# Patient Record
Sex: Female | Born: 1970 | Race: White | Hispanic: No | Marital: Single | Smoking: Current every day smoker
Health system: Southern US, Community
[De-identification: ages and names within clinical notes are randomized; demographics above are authoritative.]

## PROBLEM LIST (undated history)

## (undated) DIAGNOSIS — E119 Type 2 diabetes mellitus without complications: Secondary | ICD-10-CM

## (undated) DIAGNOSIS — E669 Obesity, unspecified: Secondary | ICD-10-CM

## (undated) DIAGNOSIS — F419 Anxiety disorder, unspecified: Secondary | ICD-10-CM

## (undated) DIAGNOSIS — E282 Polycystic ovarian syndrome: Secondary | ICD-10-CM

## (undated) DIAGNOSIS — Z720 Tobacco use: Secondary | ICD-10-CM

## (undated) HISTORY — PX: OTHER SURGICAL HISTORY: SHX169

---

## 2014-02-18 ENCOUNTER — Encounter (HOSPITAL_COMMUNITY): Payer: Self-pay | Admitting: Emergency Medicine

## 2014-02-18 ENCOUNTER — Emergency Department (HOSPITAL_COMMUNITY)
Admission: EM | Admit: 2014-02-18 | Discharge: 2014-02-18 | Disposition: A | Discharge status: Home / Self Care | Payer: Self-pay | Attending: Emergency Medicine | Admitting: Emergency Medicine

## 2014-02-18 DIAGNOSIS — F17200 Nicotine dependence, unspecified, uncomplicated: Secondary | ICD-10-CM | POA: Insufficient documentation

## 2014-02-18 DIAGNOSIS — K529 Noninfective gastroenteritis and colitis, unspecified: Secondary | ICD-10-CM

## 2014-02-18 DIAGNOSIS — R109 Unspecified abdominal pain: Secondary | ICD-10-CM

## 2014-02-18 DIAGNOSIS — Z3202 Encounter for pregnancy test, result negative: Secondary | ICD-10-CM | POA: Insufficient documentation

## 2014-02-18 DIAGNOSIS — K5289 Other specified noninfective gastroenteritis and colitis: Secondary | ICD-10-CM | POA: Insufficient documentation

## 2014-02-18 DIAGNOSIS — E119 Type 2 diabetes mellitus without complications: Secondary | ICD-10-CM | POA: Insufficient documentation

## 2014-02-18 HISTORY — DX: Type 2 diabetes mellitus without complications: E11.9

## 2014-02-18 HISTORY — DX: Polycystic ovarian syndrome: E28.2

## 2014-02-18 LAB — URINALYSIS, ROUTINE W REFLEX MICROSCOPIC
Bilirubin Urine: NEGATIVE
Glucose, UA: NEGATIVE mg/dL
Hgb urine dipstick: NEGATIVE
Ketones, ur: NEGATIVE mg/dL
NITRITE: NEGATIVE
PH: 5.5 (ref 5.0–8.0)
Protein, ur: NEGATIVE mg/dL
Specific Gravity, Urine: 1.025 (ref 1.005–1.030)
Urobilinogen, UA: 0.2 mg/dL (ref 0.0–1.0)

## 2014-02-18 LAB — CBC WITH DIFFERENTIAL/PLATELET
BASOS PCT: 0 % (ref 0–1)
Basophils Absolute: 0.1 10*3/uL (ref 0.0–0.1)
EOS PCT: 0 % (ref 0–5)
Eosinophils Absolute: 0.1 10*3/uL (ref 0.0–0.7)
HEMATOCRIT: 41.6 % (ref 36.0–46.0)
Hemoglobin: 13.9 g/dL (ref 12.0–15.0)
Lymphocytes Relative: 13 % (ref 12–46)
Lymphs Abs: 1.8 10*3/uL (ref 0.7–4.0)
MCH: 29.6 pg (ref 26.0–34.0)
MCHC: 33.4 g/dL (ref 30.0–36.0)
MCV: 88.7 fL (ref 78.0–100.0)
MONO ABS: 0.6 10*3/uL (ref 0.1–1.0)
Monocytes Relative: 4 % (ref 3–12)
Neutro Abs: 11 10*3/uL — ABNORMAL HIGH (ref 1.7–7.7)
Neutrophils Relative %: 81 % — ABNORMAL HIGH (ref 43–77)
Platelets: 397 10*3/uL (ref 150–400)
RBC: 4.69 MIL/uL (ref 3.87–5.11)
RDW: 14.9 % (ref 11.5–15.5)
WBC: 13.5 10*3/uL — ABNORMAL HIGH (ref 4.0–10.5)

## 2014-02-18 LAB — COMPREHENSIVE METABOLIC PANEL
ALBUMIN: 4.2 g/dL (ref 3.5–5.2)
ALT: 16 U/L (ref 0–35)
AST: 12 U/L (ref 0–37)
Alkaline Phosphatase: 44 U/L (ref 39–117)
BUN: 16 mg/dL (ref 6–23)
CO2: 26 mEq/L (ref 19–32)
CREATININE: 0.66 mg/dL (ref 0.50–1.10)
Calcium: 9.4 mg/dL (ref 8.4–10.5)
Chloride: 101 mEq/L (ref 96–112)
GFR calc Af Amer: >90 mL/min (ref >90)
GFR calc non Af Amer: >90 mL/min (ref >90)
Glucose, Bld: 126 mg/dL — ABNORMAL HIGH (ref 70–99)
Potassium: 4.2 mEq/L (ref 3.7–5.3)
Sodium: 139 mEq/L (ref 137–147)
Total Bilirubin: 0.7 mg/dL (ref 0.3–1.2)
Total Protein: 7.6 g/dL (ref 6.0–8.3)

## 2014-02-18 LAB — URINE MICROSCOPIC-ADD ON

## 2014-02-18 LAB — LIPASE, BLOOD: LIPASE: 18 U/L (ref 11–59)

## 2014-02-18 LAB — PREGNANCY, URINE: Preg Test, Ur: NEGATIVE

## 2014-02-18 MED ORDER — GI COCKTAIL ~~LOC~~
30.0000 mL | Freq: Once | ORAL | Status: AC
  Administered 2014-02-18: 30 mL via ORAL
  Filled 2014-02-18: qty 30

## 2014-02-18 MED ORDER — ONDANSETRON 4 MG PO TBDP
4.0000 mg | ORAL_TABLET | Freq: Once | ORAL | Status: AC
  Administered 2014-02-18: 4 mg via ORAL
  Filled 2014-02-18: qty 1

## 2014-02-18 MED ORDER — ONDANSETRON 4 MG PO TBDP: ORAL_TABLET | ORAL | Status: AC

## 2014-02-18 NOTE — Progress Notes (Signed)
[facility] tried to speak with patient about community resources, but patient was on the telephone. Left patient with a list of self-pay PCP to help establish primary care.

## 2014-02-18 NOTE — ED Notes (Signed)
Pt states that yesterday, she began having upper, mid abd pain.  States that she has been having diarrhea and nausea but no vomiting.

## 2014-02-18 NOTE — ED Notes (Signed)
She tells me she is having mid-epigastric pain which began yesterday at about 1000 hours and persists.  She also c/o nausea, but denies vomiting.  She also tells me she has had about 8 diarrhea stools since yesterday in which she denies seeing any blood.

## 2014-02-18 NOTE — ED Notes (Signed)
MD at bedside. 

## 2014-02-18 NOTE — Discharge Instructions (Signed)
Abdominal (belly) pain can be caused by many things. Your caregiver performed an examination and possibly ordered blood/urine tests and imaging (CT scan, x-rays, ultrasound). Many cases can be observed and treated at home after initial evaluation in the emergency department. Even though you are being discharged home, abdominal pain can be unpredictable. Therefore, you need a repeated exam if your pain does not resolve, returns, or worsens. Most patients with abdominal pain don't have to be admitted to the hospital or have surgery, but serious problems like appendicitis and gallbladder attacks can start out as nonspecific pain. Many abdominal conditions cannot be diagnosed in one visit, so follow-up evaluations are very important. SEEK IMMEDIATE MEDICAL ATTENTION IF: The pain does not go away or becomes severe.  A temperature above 101 develops.  Repeated vomiting occurs (multiple episodes).  The pain becomes localized to portions of the abdomen. The right side could possibly be appendicitis. In an adult, the left lower portion of the abdomen could be colitis or diverticulitis.  Blood is being passed in stools or vomit (bright red or black tarry stools).  Return also if you develop chest pain, difficulty breathing, dizziness or fainting, or become confused, poorly responsive, or inconsolable (young children).     Abdominal Pain, Women Abdominal (stomach, pelvic, or belly) pain can be caused by many things. It is important to tell your doctor:  The location of the pain.  Does it come and go or is it present all the time?  Are there things that start the pain (eating certain foods, exercise)?  Are there other symptoms associated with the pain (fever, nausea, vomiting, diarrhea)? All of this is helpful to know when trying to find the cause of the pain. CAUSES   Stomach: virus or bacteria infection, or ulcer.  Intestine: appendicitis (inflamed appendix), regional ileitis (Crohn's disease),  ulcerative colitis (inflamed colon), irritable bowel syndrome, diverticulitis (inflamed diverticulum of the colon), or cancer of the stomach or intestine.  Gallbladder disease or stones in the gallbladder.  Kidney disease, kidney stones, or infection.  Pancreas infection or cancer.  Fibromyalgia (pain disorder).  Diseases of the female organs:  Uterus: fibroid (non-cancerous) tumors or infection.  Fallopian tubes: infection or tubal pregnancy.  Ovary: cysts or tumors.  Pelvic adhesions (scar tissue).  Endometriosis (uterus lining tissue growing in the pelvis and on the pelvic organs).  Pelvic congestion syndrome (female organs filling up with blood just before the menstrual period).  Pain with the menstrual period.  Pain with ovulation (producing an egg).  Pain with an IUD (intrauterine device, birth control) in the uterus.  Cancer of the female organs.  Functional pain (pain not caused by a disease, may improve without treatment).  Psychological pain.  Depression. DIAGNOSIS  Your doctor will decide the seriousness of your pain by doing an examination.  Blood tests.  X-rays.  Ultrasound.  CT scan (computed tomography, special type of X-ray).  MRI (magnetic resonance imaging).  Cultures, for infection.  Barium enema (dye inserted in the large intestine, to better view it with X-rays).  Colonoscopy (looking in intestine with a lighted tube).  Laparoscopy (minor surgery, looking in abdomen with a lighted tube).  Major abdominal exploratory surgery (looking in abdomen with a large incision). TREATMENT  The treatment will depend on the cause of the pain.   Many cases can be observed and treated at home.  Over-the-counter medicines recommended by your caregiver.  Prescription medicine.  Antibiotics, for infection.  Birth control pills, for painful periods or for ovulation  pain.  Hormone treatment, for endometriosis.  Nerve blocking  injections.  Physical therapy.  Antidepressants.  Counseling with a psychologist or psychiatrist.  Minor or major surgery. HOME CARE INSTRUCTIONS   Do not take laxatives, unless directed by your caregiver.  Take over-the-counter pain medicine only if ordered by your caregiver. Do not take aspirin because it can cause an upset stomach or bleeding.  Try a clear liquid diet (broth or water) as ordered by your caregiver. Slowly move to a bland diet, as tolerated, if the pain is related to the stomach or intestine.  Have a thermometer and take your temperature several times a day, and record it.  Bed rest and sleep, if it helps the pain.  Avoid sexual intercourse, if it causes pain.  Avoid stressful situations.  Keep your follow-up appointments and tests, as your caregiver orders.  If the pain does not go away with medicine or surgery, you may try:  Acupuncture.  Relaxation exercises (yoga, meditation).  Group therapy.  Counseling. SEEK MEDICAL CARE IF:   You notice certain foods cause stomach pain.  Your home care treatment is not helping your pain.  You need stronger pain medicine.  You want your IUD removed.  You feel faint or lightheaded.  You develop nausea and vomiting.  You develop a rash.  You are having side effects or an allergy to your medicine. SEEK IMMEDIATE MEDICAL CARE IF:   Your pain does not go away or gets worse.  You have a fever.  Your pain is felt only in portions of the abdomen. The right side could possibly be appendicitis. The left lower portion of the abdomen could be colitis or diverticulitis.  You are passing blood in your stools (bright red or black tarry stools, with or without vomiting).  You have blood in your urine.  You develop chills, with or without a fever.  You pass out. MAKE SURE YOU:   Understand these instructions.  Will watch your condition.  Will get help right away if you are not doing well or get  worse. Document Released: 08/27/2007 Document Revised: 01/22/2012 Document Reviewed: 09/16/2009 [facility] Patient Information 2014 [place], [place].

## 2014-02-18 NOTE — ED Provider Notes (Signed)
CSN: [id number]     Arrival date & time 02/18/14  [id number] History   First MD Initiated Contact with Patient 02/18/14 0901     Chief Complaint  Patient presents with  . Abdominal Pain  . Diarrhea     (Consider location/radiation/quality/duration/timing/severity/associated sxs/prior Treatment) HPI 43 year old female presents with intermittent epigastric abdominal pain since yesterday. She states it started about 24 hours ago. The pain comes and goes, lasting about 10 minutes to an hour at a time. Nothing specific makes the pain better or worse. She did state initially yesterday she seemed to have some relief with taking her brought off of her chest. When she put it back on it did seem to increase the pressure on her stomach. She states the pain is mostly a pressure-like sensation. Eating does not make it specifically better or worse. She gets nauseous with the pain. Since yesterday she's also been having several loose stools. Denies any blood in her stools. No shortness of breath or fevers. She currently has an 8/10 pain. Denies any radiation of pain to back. No urinary symptoms.  Past Medical History  Diagnosis Date  . PCOS (polycystic ovarian syndrome)   . Diabetes mellitus without complication    History reviewed. No pertinent past surgical history. History reviewed. No pertinent family history. History  Substance Use Topics  . Smoking status: Current Every Day Smoker -- 1.00 packs/day    Types: Cigarettes  . Smokeless tobacco: Not on file  . Alcohol Use: No   OB History   Grav Para Term Preterm Abortions TAB SAB Ect Mult Living                 Review of Systems  Constitutional: Negative for fever.  Respiratory: Negative for shortness of breath.   Cardiovascular: Negative for chest pain.  Gastrointestinal: Positive for nausea, abdominal pain and diarrhea. Negative for vomiting, constipation and blood in stool.  Genitourinary: Negative for dysuria.  Musculoskeletal: Negative for  back pain.  All other systems reviewed and are negative.     Allergies  Aspirin  Home Medications  No current outpatient prescriptions on file. BP 160/76  Pulse 77  Temp(Src) 98.1 F (36.7 C) (Oral)  Resp 16  SpO2 100%  LMP 01/28/2014 Physical Exam  Nursing note and vitals reviewed. Constitutional: She is oriented to person, place, and time. She appears well-developed and well-nourished.  HENT:  Head: Normocephalic and atraumatic.  Right Ear: External ear normal.  Left Ear: External ear normal.  Nose: Nose normal.  Eyes: Right eye exhibits no discharge. Left eye exhibits no discharge.  Cardiovascular: Normal rate, regular rhythm and normal heart sounds.   Pulmonary/Chest: Effort normal and breath sounds normal.  Abdominal: Soft. She exhibits no distension. There is tenderness in the epigastric area.  Neurological: She is alert and oriented to person, place, and time.  Skin: Skin is warm and dry.    ED Course  Procedures (including critical care time) Labs Review Labs Reviewed  CBC WITH DIFFERENTIAL - Abnormal; Notable for the following:    WBC 13.5 (*)    Neutrophils Relative % 81 (*)    Neutro Abs 11.0 (*)    All other components within normal limits  COMPREHENSIVE METABOLIC PANEL - Abnormal; Notable for the following:    Glucose, Bld 126 (*)    All other components within normal limits  URINALYSIS, ROUTINE W REFLEX MICROSCOPIC - Abnormal; Notable for the following:    APPearance CLOUDY (*)    Leukocytes, UA SMALL (*)  All other components within normal limits  URINE MICROSCOPIC-ADD ON - Abnormal; Notable for the following:    Bacteria, UA FEW (*)    All other components within normal limits  LIPASE, BLOOD  PREGNANCY, URINE   Imaging Review No results found.   EKG Interpretation   Date/Time:  Wednesday February 18 2014 10:05:29 EDT Ventricular Rate:  79 PR Interval:  135 QRS Duration: 93 QT Interval:  368 QTC Calculation: 422 R Axis:   80 Text  Interpretation:  Normal sinus rhythm Early repolarization Normal ECG  No old tracing to compare Confirmed by Tully Burgo  MD, Gavriella Hearst ([id number]) on  02/18/2014 10:08:34 AM      MDM   Final diagnoses:  Abdominal pain  Gastroenteritis    Patient appears well hydrated and has mild tenderness on exam. Likely a gastroenteritis with her nausea and diarrhea. Given she has palpable tenderness and normal ECG, I doubt this is atypical ACS. Symptoms resolved after GI cocktail and zofran. Will treat with supportive care with fluids, zofran and discussed strict return precautions.     Audree Camel, MD 02/18/14 [phone]

## 2016-10-05 ENCOUNTER — Emergency Department (HOSPITAL_COMMUNITY): Payer: Self-pay

## 2016-10-05 ENCOUNTER — Emergency Department (HOSPITAL_COMMUNITY)
Admission: EM | Admit: 2016-10-05 | Discharge: 2016-10-05 | Disposition: A | Discharge status: Home / Self Care | Payer: Self-pay | Attending: Emergency Medicine | Admitting: Emergency Medicine

## 2016-10-05 ENCOUNTER — Encounter (HOSPITAL_COMMUNITY): Payer: Self-pay

## 2016-10-05 DIAGNOSIS — F17210 Nicotine dependence, cigarettes, uncomplicated: Secondary | ICD-10-CM | POA: Insufficient documentation

## 2016-10-05 DIAGNOSIS — E282 Polycystic ovarian syndrome: Secondary | ICD-10-CM

## 2016-10-05 DIAGNOSIS — E669 Obesity, unspecified: Secondary | ICD-10-CM | POA: Diagnosis present

## 2016-10-05 DIAGNOSIS — R55 Syncope and collapse: Secondary | ICD-10-CM | POA: Insufficient documentation

## 2016-10-05 DIAGNOSIS — R9431 Abnormal electrocardiogram [ECG] [EKG]: Secondary | ICD-10-CM

## 2016-10-05 DIAGNOSIS — Z79899 Other long term (current) drug therapy: Secondary | ICD-10-CM | POA: Insufficient documentation

## 2016-10-05 DIAGNOSIS — E119 Type 2 diabetes mellitus without complications: Secondary | ICD-10-CM | POA: Insufficient documentation

## 2016-10-05 DIAGNOSIS — Z7984 Long term (current) use of oral hypoglycemic drugs: Secondary | ICD-10-CM | POA: Insufficient documentation

## 2016-10-05 DIAGNOSIS — Z720 Tobacco use: Secondary | ICD-10-CM

## 2016-10-05 HISTORY — DX: Tobacco use: Z72.0

## 2016-10-05 HISTORY — DX: Anxiety disorder, unspecified: F41.9

## 2016-10-05 HISTORY — DX: Obesity, unspecified: E66.9

## 2016-10-05 LAB — RAPID URINE DRUG SCREEN, HOSP PERFORMED
AMPHETAMINES: NOT DETECTED
BENZODIAZEPINES: NOT DETECTED
Barbiturates: NOT DETECTED
Cocaine: NOT DETECTED
OPIATES: NOT DETECTED
Tetrahydrocannabinol: POSITIVE — AB

## 2016-10-05 LAB — URINALYSIS, ROUTINE W REFLEX MICROSCOPIC
BILIRUBIN URINE: NEGATIVE
Glucose, UA: >1000 mg/dL — AB
Ketones, ur: 40 mg/dL — AB
Leukocytes, UA: NEGATIVE
NITRITE: NEGATIVE
PH: 6 (ref 5.0–8.0)
Protein, ur: 30 mg/dL — AB
SPECIFIC GRAVITY, URINE: 1.031 — AB (ref 1.005–1.030)

## 2016-10-05 LAB — CBC WITH DIFFERENTIAL/PLATELET
BASOS ABS: 0 10*3/uL (ref 0.0–0.1)
BASOS PCT: 0 %
EOS PCT: 0 %
Eosinophils Absolute: 0 10*3/uL (ref 0.0–0.7)
HCT: 43.7 % (ref 36.0–46.0)
Hemoglobin: 14.9 g/dL (ref 12.0–15.0)
LYMPHS PCT: 13 %
Lymphs Abs: 2.2 10*3/uL (ref 0.7–4.0)
MCH: 29.7 pg (ref 26.0–34.0)
MCHC: 34.1 g/dL (ref 30.0–36.0)
MCV: 87.2 fL (ref 78.0–100.0)
Monocytes Absolute: 0.7 10*3/uL (ref 0.1–1.0)
Monocytes Relative: 4 %
Neutro Abs: 14.1 10*3/uL — ABNORMAL HIGH (ref 1.7–7.7)
Neutrophils Relative %: 83 %
PLATELETS: 354 10*3/uL (ref 150–400)
RBC: 5.01 MIL/uL (ref 3.87–5.11)
RDW: 13.9 % (ref 11.5–15.5)
WBC: 17.1 10*3/uL — AB (ref 4.0–10.5)

## 2016-10-05 LAB — LACTIC ACID, PLASMA: LACTIC ACID, VENOUS: 2 mmol/L — AB (ref 0.5–1.9)

## 2016-10-05 LAB — COMPREHENSIVE METABOLIC PANEL
ALBUMIN: 3.8 g/dL (ref 3.5–5.0)
ALT: 31 U/L (ref 14–54)
ANION GAP: 11 (ref 5–15)
AST: 30 U/L (ref 15–41)
Alkaline Phosphatase: 61 U/L (ref 38–126)
BUN: 11 mg/dL (ref 6–20)
CO2: 21 mmol/L — AB (ref 22–32)
Calcium: 9.1 mg/dL (ref 8.9–10.3)
Chloride: 98 mmol/L — ABNORMAL LOW (ref 101–111)
Creatinine, Ser: 0.85 mg/dL (ref 0.44–1.00)
GFR calc Af Amer: >60 mL/min (ref >60)
GFR calc non Af Amer: >60 mL/min (ref >60)
GLUCOSE: 289 mg/dL — AB (ref 65–99)
POTASSIUM: 4.8 mmol/L (ref 3.5–5.1)
SODIUM: 130 mmol/L — AB (ref 135–145)
Total Bilirubin: 1.7 mg/dL — ABNORMAL HIGH (ref 0.3–1.2)
Total Protein: 6.7 g/dL (ref 6.5–8.1)

## 2016-10-05 LAB — URINE MICROSCOPIC-ADD ON

## 2016-10-05 LAB — PROCALCITONIN

## 2016-10-05 LAB — I-STAT BETA HCG BLOOD, ED (MC, WL, AP ONLY): I-stat hCG, quantitative: <5 m[IU]/mL (ref <5)

## 2016-10-05 LAB — CBC
HCT: 44.8 % (ref 36.0–46.0)
Hemoglobin: 15.9 g/dL — ABNORMAL HIGH (ref 12.0–15.0)
MCH: 30.8 pg (ref 26.0–34.0)
MCHC: 35.5 g/dL (ref 30.0–36.0)
MCV: 86.8 fL (ref 78.0–100.0)
Platelets: 287 10*3/uL (ref 150–400)
RBC: 5.16 MIL/uL — ABNORMAL HIGH (ref 3.87–5.11)
RDW: 13.7 % (ref 11.5–15.5)
WBC: 19.9 10*3/uL — AB (ref 4.0–10.5)

## 2016-10-05 LAB — I-STAT TROPONIN, ED: Troponin i, poc: 0 ng/mL (ref 0.00–0.08)

## 2016-10-05 LAB — TROPONIN I
TROPONIN I: 0.1 ng/mL — AB (ref <0.03)
Troponin I: 0.03 ng/mL (ref <0.03)

## 2016-10-05 LAB — POC URINE PREG, ED: Preg Test, Ur: NEGATIVE

## 2016-10-05 MED ORDER — SODIUM CHLORIDE 0.9 % IV BOLUS (SEPSIS)
1000.0000 mL | Freq: Once | INTRAVENOUS | Status: AC
  Administered 2016-10-05: 1000 mL via INTRAVENOUS

## 2016-10-05 MED ORDER — CLOPIDOGREL BISULFATE 75 MG PO TABS
75.0000 mg | ORAL_TABLET | Freq: Once | ORAL | Status: AC
  Administered 2016-10-05: 75 mg via ORAL
  Filled 2016-10-05: qty 1

## 2016-10-05 NOTE — Consult Note (Signed)
CARDIOLOGY CONSULT NOTE   Patient ID: Breanna Gibson MRN: [id number] DOB/AGE: 44/07/1971 44 y.o.  Admit date: 10/05/2016  Requesting Physician: Dr. Clayborne DanaMesner Primary Physician:   No primary care provider on file. Primary Cardiologist:  New Reason for Consultation: syncope  HPI: Breanna Gibson is a 45 y.o. female with a history of DMT2, tobacco abuse, PCOS and anxiety who [facility] for evaluation of syncope. [facility] she was noted to have transient inferior ST elevation with reciprocal (lead I and AVL) ST depression and cardiology was consulted.  She was in her usual state of health until this afternoon when she took a hot bath. When she got out of the bath and walked to her closet, she suddenly felt dizzy and diaphoretic. The next thing she knew she was on the floor. Her boyfriend heard the fall and rushed in. She was only out for seconds but it appeared she had hit her head on the side of the bed. She woke up mildly confused. Every time she went to stand up, she started to feel weak, dizzy and diaphoretic again. She has had similar episodes of syncope in the past but they always resolved very quickly. She also felt some mild chest pressure that was similar to previous anxiety attacks. 1/10 chest pressure. Because of the continued dizziness and presyncope EMS was called. Since arrival to the hospital she has felt fine. She denies exertional chest pain or shortness of breath, although she is not very active at baseline. No LE edema, orthopnea or PND.   She recently had a mild cold with rhinorrhea and sore throat.   Pertinent labs in the ER: WBC 19.9, NA 130, K 4.8, creat 0.87, BG 289 and POC troponin 0.  Chest x-ray with no acute pulmonary disease and head CT nonacute.   Past Medical History:  Diagnosis Date  . Anxiety   . Diabetes mellitus without complication (HCC)   . Obesity   . PCOS (polycystic ovarian syndrome)   . Tobacco abuse      Past Surgical History:  Procedure Laterality Date  . cyst removal     from ovaries    Allergies  Allergen Reactions  . Aspirin Anaphylaxis    Hives, throat swelling    I have reviewed the patient's current medications     Prior to Admission medications   Medication Sig Start Date End Date Taking? Authorizing Provider  acetaminophen (TYLENOL) 500 MG tablet Take 1,000 mg by mouth every 6 (six) hours as needed for mild pain.    Historical Provider, MD  metFORMIN (GLUCOPHAGE) 500 MG tablet Take 500 mg by mouth 2 (two) times daily with a meal.    Historical Provider, MD  norethindrone-ethinyl estradiol 1/35 (ORTHO-NOVUM, NORTREL,CYCLAFEM) tablet Take 1 tablet by mouth daily.    Historical Provider, MD  ondansetron (ZOFRAN ODT) 4 MG disintegrating tablet 4mg  ODT q4 hours prn nausea/vomit 02/18/14   Pricilla LovelessScott Goldston, MD     Social History   Social History  . Marital status: Single    Spouse name: N/A  . Number of children: N/A  . Years of education: N/A   Occupational History  . Not on file.   Social History Main Topics  . Smoking status: Current Every Day Smoker    Packs/day: 2.00    Types: Cigarettes  . Smokeless tobacco: Never Used  . Alcohol use Yes     Comment: rarely   . Drug use:  Types: Marijuana  . Sexual activity: Not on file   Other Topics Concern  . Not on file   Social History Narrative  . No narrative on file    Family Status  Relation Status  . Mother   . Father   . Maternal Grandmother   . Maternal Grandfather    Family History  Problem Relation Age of Onset  . Colon cancer Mother   . Lung cancer Father   . CAD Father   . Diabetes Maternal Grandmother   . Diabetes Maternal Grandfather     ROS:  Full 14 point review of systems complete and found to be negative unless listed above.  Physical Exam: Blood pressure 152/88, pulse 84, resp. rate 20, height 5\' 10"  (1.778 m), weight 252 lb (114.3 kg), last menstrual period 09/04/2016, SpO2 98  %.  General: Well developed, well nourished, female in no acute distress, obese Head: Eyes PERRLA, No xanthomas.   Normocephalic and atraumatic, oropharynx without edema or exudate.   Lungs: CTAB Heart: HRRR S1 S2, no rub/gallop, Heart regular rate and rhythm with S1, S2  murmur. pulses are 2+ extrem.   Neck: No carotid bruits. No lymphadenopathy. No JVD. Abdomen: Bowel sounds present, abdomen soft and non-tender without masses or hernias noted. Msk:  No spine or cva tenderness. No weakness, no joint deformities or effusions. Extremities: No clubbing or cyanosis. No LE  edema.  Neuro: Alert and oriented X 3. No focal deficits noted. Psych:  Good affect, responds appropriately Skin: No rashes or lesions noted.  Labs:   Lab Results  Component Value Date   WBC 19.9 (H) 10/05/2016   HGB 15.9 (H) 10/05/2016   HCT 44.8 10/05/2016   MCV 86.8 10/05/2016   PLT 287 10/05/2016   No results for input(s): INR in the last 72 hours.   Recent Labs Lab 10/05/16 1443  NA 130*  K 4.8  CL 98*  CO2 21*  BUN 11  CREATININE 0.85  CALCIUM 9.1  PROT 6.7  BILITOT 1.7*  ALKPHOS 61  ALT 31  AST 30  GLUCOSE 289*  ALBUMIN 3.8   No results found for: MG  Recent Labs  10/05/16 1443  TROPONINI 0.03*    Recent Labs  10/05/16 1509  TROPIPOC 0.00   No results found for: PROBNP No results found for: CHOL, HDL, LDLCALC, TRIG No results found for: DDIMER Lipase  Date/Time Value Ref Range Status  02/18/2014 09:30 AM 18 11 - 59 U/L Final   No results found for: TSH, T4TOTAL, T3FREE, THYROIDAB No results found for: VITAMINB12, FOLATE, FERRITIN, TIBC, IRON, RETICCTPCT   ECG: NSR with HR 80 and TWI in lead III, previous ECG from EMS reviewed which did show significnat inferior ST elevation with reciprocal (lead I and AVL) ST depression  Radiology:  Dg Chest 2 View  Result Date: 10/05/2016 CLINICAL DATA:  Syncopal episode.  Generalized chest pressure. EXAM: CHEST  2 VIEW COMPARISON:  None.  FINDINGS: Normal cardiac silhouette and mediastinal contours. Minimal left basilar heterogeneous opacities favored to represent atelectasis. ECG pad overlies the right upper chest. No discrete focal airspace opacities. No pleural effusion or pneumothorax. No evidence of edema. No acute osseus abnormalities. IMPRESSION: No acute cardiopulmonary disease. Electronically Signed   By: Simonne Come M.D.   On: 10/05/2016 15:35   Ct Head Wo Contrast  Result Date: 10/05/2016 CLINICAL DATA:  Syncope with fall from standing and head injury. Dizziness. Initial encounter. EXAM: CT HEAD WITHOUT CONTRAST CT CERVICAL SPINE WITHOUT  CONTRAST TECHNIQUE: Multidetector CT imaging of the head and cervical spine was performed following the standard protocol without intravenous contrast. Multiplanar CT image reconstructions of the cervical spine were also generated. COMPARISON:  None. FINDINGS: CT HEAD FINDINGS Brain: Negative. No evidence of acute infarction, hemorrhage, hydrocephalus, extra-axial collection or mass lesion/mass effect. Vascular: Negative Skull: Occipital scalp contusion without fracture. Sinuses/Orbits: Negative CT CERVICAL SPINE FINDINGS Alignment: No traumatic malalignment. Skull base and vertebrae: Negative for fracture. Sclerosis in the C7 left lamina is consistent with a bone island. Soft tissues and spinal canal: No prevertebral fluid or swelling. No visible canal hematoma. Disc levels: C6-7 disc degeneration with endplate spurring and disc narrowing. No evidence of impingement Upper chest: Negative IMPRESSION: 1. No evidence of intracranial or cervical spine injury. 2. Occipital scalp contusion without fracture. Electronically Signed   By: Marnee Spring M.D.   On: 10/05/2016 15:27   Ct Cervical Spine Wo Contrast  Result Date: 10/05/2016 CLINICAL DATA:  Syncope with fall from standing and head injury. Dizziness. Initial encounter. EXAM: CT HEAD WITHOUT CONTRAST CT CERVICAL SPINE WITHOUT CONTRAST  TECHNIQUE: Multidetector CT imaging of the head and cervical spine was performed following the standard protocol without intravenous contrast. Multiplanar CT image reconstructions of the cervical spine were also generated. COMPARISON:  None. FINDINGS: CT HEAD FINDINGS Brain: Negative. No evidence of acute infarction, hemorrhage, hydrocephalus, extra-axial collection or mass lesion/mass effect. Vascular: Negative Skull: Occipital scalp contusion without fracture. Sinuses/Orbits: Negative CT CERVICAL SPINE FINDINGS Alignment: No traumatic malalignment. Skull base and vertebrae: Negative for fracture. Sclerosis in the C7 left lamina is consistent with a bone island. Soft tissues and spinal canal: No prevertebral fluid or swelling. No visible canal hematoma. Disc levels: C6-7 disc degeneration with endplate spurring and disc narrowing. No evidence of impingement Upper chest: Negative IMPRESSION: 1. No evidence of intracranial or cervical spine injury. 2. Occipital scalp contusion without fracture. Electronically Signed   By: Marnee Spring M.D.   On: 10/05/2016 15:27    ASSESSMENT AND PLAN:    Principal Problem:   Syncope Active Problems:   Diabetes mellitus without complication (HCC)   PCOS (polycystic ovarian syndrome)   Tobacco abuse   Obesity  Senie Lanese is a 45 y.o. female with a history of DMT2, tobacco abuse, PCOS and anxiety who presented to [facility] today for evaluation of syncope. During transportation to [facility] via EMS she was noted to have transient inferior ST elevation with reciprocal (lead I and AVL) ST depression and cardiology was consulted.  Syncope: history most c/w vasovagal or orthostatic hypotension after a hot bath. Would get orthostatic vital signs. Will continue to monitor on tele and obtain 2D ECHO.  Chest pressure: patient reports this is similar to how she feels when anxious. POC troponin negative. Troponin I 0.03. ECG in ER wtih NSR with HR 80 and TWI in lead III, previous  ECG from EMS reviewed which did show significant transient inferior ST elevation with reciprocal (lead I and AVL) ST depression. Consider transient coronary vasospasm? No heparin for now, but would start this if enzymes bump.   DMT2: per primary team. Would hold metformin   Tobacco abuse: counseled on cessation   Morbid Obesity: Body mass index is 36.16 kg/m.   Signed: Cline Crock, PA-C 10/05/2016 4:46 PM  Pager [phone]  Co-Sign MD   Attending note:  Patient seen and examined. Reviewed records and discussed the case with Ms. [employer]. Patient presents with syncopal event and transiently abnormal ECG. Baseline history includes type  2 diabetes mellitus, anxiety, tobacco use and marijuana use. She states that she took a warm bath this afternoon, stood up to get out and felt lightheaded and diaphoretic, subsequently found herself on the floor, the event lasted for only a few seconds, she apparently hit her head posteriorly. She was anxious after this occurred, felt a feeling of mild chest pressure which she states occurs when she has an anxiety attack. She was evaluated by EMS and had a number of serial ECG tracings, some of which did show transient inferior injury current with reciprocal lateral changes.  On evaluation of the ER the patient appeared comfortable in no distress. Systolic blood pressure 130s to 150s, heart rate in the 80s in sinus rhythm by telemetry. Patient is obese, clear lungs noted, cardiac exam with RRR no gallop. Head CT revealed occipital scalp contusion without fracture, no other acute event. Chest x-ray also without acute findings. Follow-up ECG in the ER no longer showing any evidence of injury current. Initial troponin I 0.03, Debbie BC 19.9, hemoglobin 15.9, creatinine 0.85, potassium 4.8, sodium 130, glucose 289.  Presentation with syncope that sounds orthostatic/vasovagal in etiology after a warm bath with probable vasodilatation. Subsequent chest  tightness described and her ECGs did show transient inferior injury current. Initial troponin I 0.03 and she is currently stable. Suspect possible coronary vasospasm although more conventional ACS also to be considered. She is being admitted to the hospitalist service for further evaluation. Would obtain a full set of cardiac markers, follow-up ECG in the morning, and also an echocardiogram. With tobacco use and type 2 diabetes mellitus, ischemic testing will be indicated. Depending on patient's clinical status and subsequent workup, we will follow with further recommendations regarding noninvasive versus invasive cardiac assessment.  Jonelle SidleSamuel G. Tauheedah Bok, M.D., F.A.C.C.

## 2016-10-05 NOTE — ED Provider Notes (Signed)
I assumed care of this patient from Dr. Clayborne DanaMesner at 1500.  Please see their note for further details of Hx, PE.  Briefly patient is a 45 y.o. female who presented after syncopal episode resulting in head trauma. In route patient had notable dynamics ST segment elevations on EKG. Cardiology was consultation who felt that these was not a STEMI given the patient's lack of chest pain. Patient did have elevated troponin however they recommended admission to hospitalist team for continued cardiac workup. They would follow along. CT head and cervical spine were unremarkable. Patient was admitted to the hospitalists service however prior to being transported to her bed and the patient decided to leave AGAINST MEDICAL ADVICE. I discussed the risks of leaving AGAINST MEDICAL ADVICE with the patient and she understood that this could result in possible death.   Nira ConnPedro Eduardo Corabelle Spackman, MD 10/06/16 [phone]

## 2016-10-05 NOTE — ED Provider Notes (Signed)
[facility]-EMERGENCY DEPT Provider Note   CSN: [id number] Arrival date & time: 10/05/16  1437     History   Chief Complaint No chief complaint on file.   HPI Breanna Gibson is a 45 y.o. female.   Loss of Consciousness   This is a recurrent problem. The current episode started 1 to 2 hours ago. The problem occurs rarely. The problem has been resolved. She lost consciousness for a period of less than one minute. The problem is associated with standing up. Associated symptoms include chest pain, light-headedness and nausea. Pertinent negatives include visual change and vomiting. Her past medical history is significant for DM and HTN.    Past Medical History:  Diagnosis Date  . Anxiety   . Diabetes mellitus without complication (HCC)   . PCOS (polycystic ovarian syndrome)     There are no active problems to display for this patient.   Past Surgical History:  Procedure Laterality Date  . cyst removal     from ovaries    OB History    No data available       Home Medications    Prior to Admission medications   Medication Sig Start Date End Date Taking? Authorizing Provider  acetaminophen (TYLENOL) 500 MG tablet Take 1,000 mg by mouth every 6 (six) hours as needed for mild pain.    Historical Provider, MD  metFORMIN (GLUCOPHAGE) 500 MG tablet Take 500 mg by mouth 2 (two) times daily with a meal.    Historical Provider, MD  norethindrone-ethinyl estradiol 1/35 (ORTHO-NOVUM, NORTREL,CYCLAFEM) tablet Take 1 tablet by mouth daily.    Historical Provider, MD  ondansetron (ZOFRAN ODT) 4 MG disintegrating tablet 4mg  ODT q4 hours prn nausea/vomit 02/18/14   Pricilla LovelessScott Goldston, MD    Family History No family history on file.  Social History Social History  Substance Use Topics  . Smoking status: Current Every Day Smoker    Packs/day: 2.00    Types: Cigarettes  . Smokeless tobacco: Never Used  . Alcohol use Yes     Comment: rarely      Allergies   Aspirin   Review of  Systems Review of Systems  Cardiovascular: Positive for chest pain and syncope.  Gastrointestinal: Positive for nausea. Negative for vomiting.  Neurological: Positive for syncope and light-headedness.  All other systems reviewed and are negative.    Physical Exam Updated Vital Signs BP 140/75   Pulse 85   Resp 25   Ht 5\' 10"  (1.778 m)   Wt 252 lb (114.3 kg)   LMP 09/04/2016   SpO2 98%   BMI 36.16 kg/m   Physical Exam  Constitutional: She appears well-developed and well-nourished.  HENT:  Head: Normocephalic and atraumatic.  Eyes: Conjunctivae and EOM are normal.  Neck: Normal range of motion.  Cardiovascular: Normal rate and regular rhythm.   Pulmonary/Chest: Effort normal. No stridor. No respiratory distress.  Abdominal: She exhibits no distension.  Neurological: She is alert.  Skin: Skin is warm and dry.  Nursing note and vitals reviewed.    ED Treatments / Results  Labs (all labs ordered are listed, but only abnormal results are displayed) Labs Reviewed  CBC - Abnormal; Notable for the following:       Result Value   WBC 19.9 (*)    RBC 5.16 (*)    Hemoglobin 15.9 (*)    All other components within normal limits  COMPREHENSIVE METABOLIC PANEL  TROPONIN I  I-STAT TROPOININ, ED  I-STAT BETA HCG BLOOD, ED ([facility],  [facility], [facility] ONLY)  POC URINE PREG, ED    EKG  EKG Interpretation  Date/Time:  Thursday October 05 2016 14:46:38 EST Ventricular Rate:  80 PR Interval:    QRS Duration: 96 QT Interval:  360 QTC Calculation: 416 R Axis:   66 Text Interpretation:  Sinus rhythm Confirmed by [facility] MD, Barbara CowerJASON [phone]([id number]) on 10/05/2016 3:10:40 PM       Radiology No results found.  Procedures Procedures (including critical care time)  Medications Ordered in ED Medications - No data to display   Initial Impression / Assessment and Plan / ED Course  I have reviewed the triage vital signs and the nursing notes.  Pertinent labs & imaging results that were available  during my care of the patient were reviewed by me and considered in my medical decision making (see chart for details).  Clinical Course     45 yo F w/ syncope and intermittent ST elevation with reciprocal changes with EMS on the way in. fam hx of cad, personal h/o DM and HTN. Episodes went away quickly and were not assocaited with change in symptoms. On arrival I immediately consulted cardiology who did not feel a code STEMI was indicated unless she started having CP and elevations here but she did not. At time of transfer of care, patient stable, chest pain free, awaiting syncope workup and admit to hospital for monitoring and further workup, likely by medicine.   Final Clinical Impressions(s) / ED Diagnoses   Final diagnoses:  None    New Prescriptions New Prescriptions   No medications on file     Marily MemosJason Jameelah Watts, MD 10/08/16 1719

## 2016-10-05 NOTE — ED Notes (Signed)
Dr. Mesner at bedside   

## 2016-10-05 NOTE — ED Notes (Signed)
Patient transported to CT 

## 2016-10-05 NOTE — ED Notes (Signed)
Patient came to desk requesting to leave AMA. States she would like to be at home and will follow up with her Dr. Andrey CotaStates she thinks it was her not eating this AM and mixture of anxiety. Spoke with Dr. Eudelia Bunchardama, patient explained risks. Patient agreed to sign AMA. IV removed.

## 2016-10-05 NOTE — ED Triage Notes (Signed)
Per [facility]: Pt was standing at closet after a hot bath, passed out on the floor. Next thing she knew husband was there. Pts husband helped the pt into bed. The pts husband stated that the pt didn't come around like she normally does, this has happened in the past. The pt stated that prior to passing out she felt hot, light headed, and nauseated. The pt does have a knot on the back of her head, towel roll to neck. EMS stated that the first EKG they took with the pt showed STEMI. The pt said that she was having chest heaviness as well as pain across the top of her shoulder blades. The pts symptoms resolved and the monitor no longer showed STEMI, they captured this EKG. While en route, the pts symptoms returned, the monitor automatically put out another EKG and that did show STEMI. They repeated another that showed the same, then repeated two additional that no longer showed STEMI. The pts symptoms resolved and were no longer present when the EKG no longer showed STEMI.

## 2017-03-01 ENCOUNTER — Emergency Department (HOSPITAL_COMMUNITY)
Admission: EM | Admit: 2017-03-01 | Discharge: 2017-03-01 | Disposition: A | Discharge status: Home / Self Care | Payer: Self-pay | Attending: Emergency Medicine | Admitting: Emergency Medicine

## 2017-03-01 ENCOUNTER — Encounter (HOSPITAL_COMMUNITY): Payer: Self-pay | Admitting: Emergency Medicine

## 2017-03-01 DIAGNOSIS — S46911A Strain of unspecified muscle, fascia and tendon at shoulder and upper arm level, right arm, initial encounter: Secondary | ICD-10-CM | POA: Insufficient documentation

## 2017-03-01 DIAGNOSIS — Y929 Unspecified place or not applicable: Secondary | ICD-10-CM | POA: Insufficient documentation

## 2017-03-01 DIAGNOSIS — X509XXA Other and unspecified overexertion or strenuous movements or postures, initial encounter: Secondary | ICD-10-CM | POA: Insufficient documentation

## 2017-03-01 DIAGNOSIS — Z7984 Long term (current) use of oral hypoglycemic drugs: Secondary | ICD-10-CM | POA: Insufficient documentation

## 2017-03-01 DIAGNOSIS — M70911 Unspecified soft tissue disorder related to use, overuse and pressure, right shoulder: Secondary | ICD-10-CM | POA: Insufficient documentation

## 2017-03-01 DIAGNOSIS — Y990 Civilian activity done for income or pay: Secondary | ICD-10-CM | POA: Insufficient documentation

## 2017-03-01 DIAGNOSIS — X503XXA Overexertion from repetitive movements, initial encounter: Secondary | ICD-10-CM

## 2017-03-01 DIAGNOSIS — Y939 Activity, unspecified: Secondary | ICD-10-CM | POA: Insufficient documentation

## 2017-03-01 DIAGNOSIS — F17210 Nicotine dependence, cigarettes, uncomplicated: Secondary | ICD-10-CM | POA: Insufficient documentation

## 2017-03-01 DIAGNOSIS — Z79899 Other long term (current) drug therapy: Secondary | ICD-10-CM | POA: Insufficient documentation

## 2017-03-01 DIAGNOSIS — M25511 Pain in right shoulder: Secondary | ICD-10-CM

## 2017-03-01 DIAGNOSIS — M7501 Adhesive capsulitis of right shoulder: Secondary | ICD-10-CM | POA: Insufficient documentation

## 2017-03-01 DIAGNOSIS — E119 Type 2 diabetes mellitus without complications: Secondary | ICD-10-CM | POA: Insufficient documentation

## 2017-03-01 MED ORDER — MELOXICAM 15 MG PO TABS: 15.0000 mg | ORAL_TABLET | Freq: Every day | ORAL | 0 refills | Status: AC
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
## 2017-03-01 NOTE — ED Notes (Signed)
Patient d/c;d self care.  f/U and medications reviewed.  Patient verbalized understanding. 

## 2017-03-01 NOTE — Discharge Instructions (Signed)
Rest your shoulder for the next 2-3 days, then begin performing gentle range of motion exercises. Use heat to your shoulder throughout the day, using a heat pack for no more than 20 minutes at a time every hour. Use mobic as directed, which takes the place of any other NSAIDs so you should not take any additional motrin/aleve/etc. Use additional tylenol as needed for additional pain relief. Call orthopedic follow up today or tomorrow to schedule followup appointment for recheck of ongoing shoulder pain in 1-2 weeks. Return to the ER for changes or worsening symptoms.

## 2017-07-05 DIAGNOSIS — R030 Elevated blood-pressure reading, without diagnosis of hypertension: Secondary | ICD-10-CM | POA: Diagnosis not present

## 2017-07-05 DIAGNOSIS — E1165 Type 2 diabetes mellitus with hyperglycemia: Secondary | ICD-10-CM | POA: Diagnosis not present

## 2017-07-05 DIAGNOSIS — F17200 Nicotine dependence, unspecified, uncomplicated: Secondary | ICD-10-CM | POA: Diagnosis not present

## 2017-07-05 DIAGNOSIS — F419 Anxiety disorder, unspecified: Secondary | ICD-10-CM | POA: Diagnosis not present

## 2017-10-11 DIAGNOSIS — E1165 Type 2 diabetes mellitus with hyperglycemia: Secondary | ICD-10-CM | POA: Diagnosis not present

## 2017-10-11 DIAGNOSIS — G894 Chronic pain syndrome: Secondary | ICD-10-CM | POA: Diagnosis not present

## 2017-10-11 DIAGNOSIS — E282 Polycystic ovarian syndrome: Secondary | ICD-10-CM | POA: Diagnosis not present

## 2017-10-11 DIAGNOSIS — M5380 Other specified dorsopathies, site unspecified: Secondary | ICD-10-CM | POA: Diagnosis not present

## 2017-10-11 DIAGNOSIS — F331 Major depressive disorder, recurrent, moderate: Secondary | ICD-10-CM | POA: Diagnosis not present

## 2018-02-18 DIAGNOSIS — E119 Type 2 diabetes mellitus without complications: Secondary | ICD-10-CM | POA: Diagnosis not present

## 2018-02-18 DIAGNOSIS — Z0000 Encounter for general adult medical examination without abnormal findings: Secondary | ICD-10-CM | POA: Diagnosis not present

## 2018-02-18 DIAGNOSIS — R82998 Other abnormal findings in urine: Secondary | ICD-10-CM | POA: Diagnosis not present

## 2018-02-26 DIAGNOSIS — Z0000 Encounter for general adult medical examination without abnormal findings: Secondary | ICD-10-CM | POA: Diagnosis not present

## 2018-02-26 DIAGNOSIS — E119 Type 2 diabetes mellitus without complications: Secondary | ICD-10-CM | POA: Diagnosis not present

## 2018-02-26 DIAGNOSIS — R5383 Other fatigue: Secondary | ICD-10-CM | POA: Diagnosis not present

## 2018-02-26 DIAGNOSIS — Z1389 Encounter for screening for other disorder: Secondary | ICD-10-CM | POA: Diagnosis not present

## 2018-02-26 DIAGNOSIS — R7989 Other specified abnormal findings of blood chemistry: Secondary | ICD-10-CM | POA: Diagnosis not present

## 2018-02-26 DIAGNOSIS — E559 Vitamin D deficiency, unspecified: Secondary | ICD-10-CM | POA: Diagnosis not present

## 2018-02-26 DIAGNOSIS — M5380 Other specified dorsopathies, site unspecified: Secondary | ICD-10-CM | POA: Diagnosis not present

## 2018-03-05 DIAGNOSIS — Z1212 Encounter for screening for malignant neoplasm of rectum: Secondary | ICD-10-CM | POA: Diagnosis not present

## 2018-03-26 ENCOUNTER — Encounter: Payer: Self-pay | Admitting: Internal Medicine

## 2018-05-19 IMAGING — CT CT CERVICAL SPINE W/O CM
2 of 8 series · 6 of 33 positions shown, 7 images · non-contrast
Comparison: None.

CLINICAL DATA: Syncope with fall from standing and head injury.
Dizziness. Initial encounter.

EXAM:
CT HEAD WITHOUT CONTRAST
CT CERVICAL SPINE WITHOUT CONTRAST
TECHNIQUE: Multidetector CT imaging of the head and cervical spine was
performed following the standard protocol without intravenous
contrast. Multiplanar CT image reconstructions of the cervical spine
were also generated.

[Series 304: axial · axial · 0.33mm/px · z∈[+93,+185]mm · 3 of 102 slices shown, 4 images]
[im 26/102  soft-tissue]
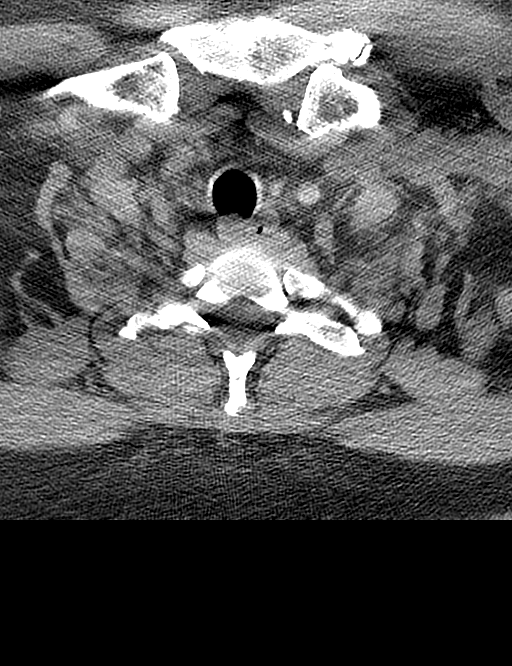
[im 26/102  bone]
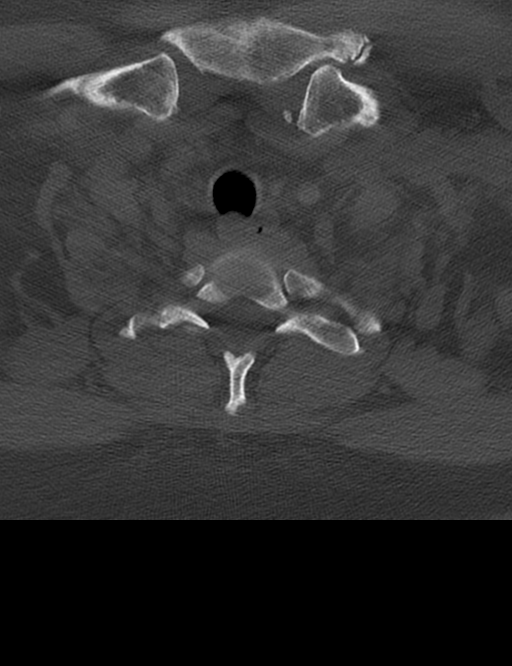
[im 51/102  bone]
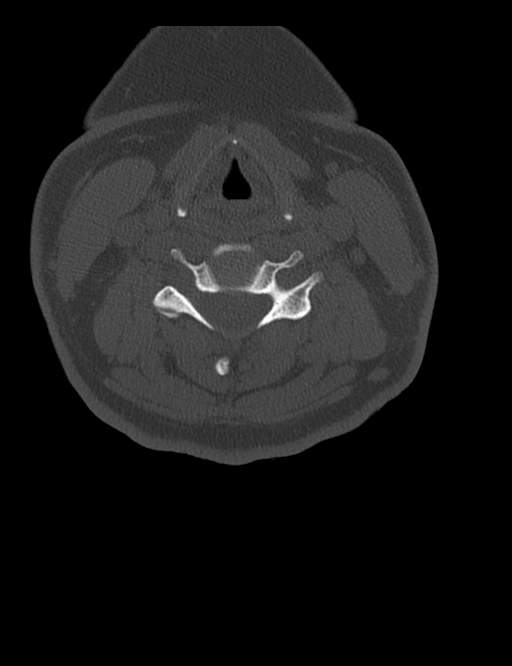
[im 76/102  bone]
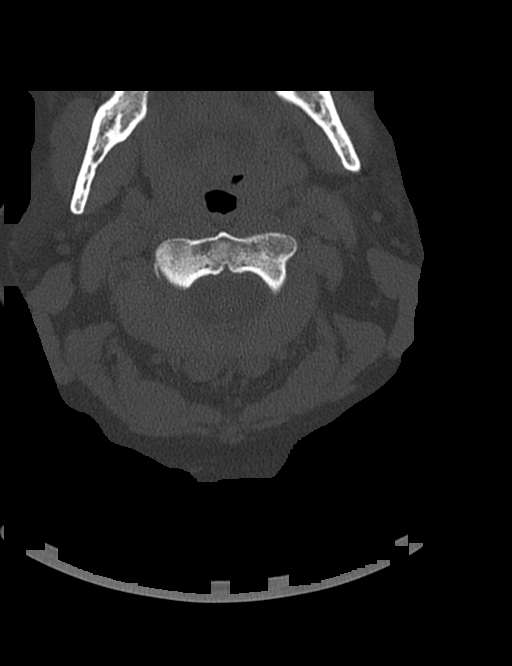

[Series 306: sagittal · sagittal · 0.33mm/px · 3 of 47 slices shown]
[im 12/47  bone]
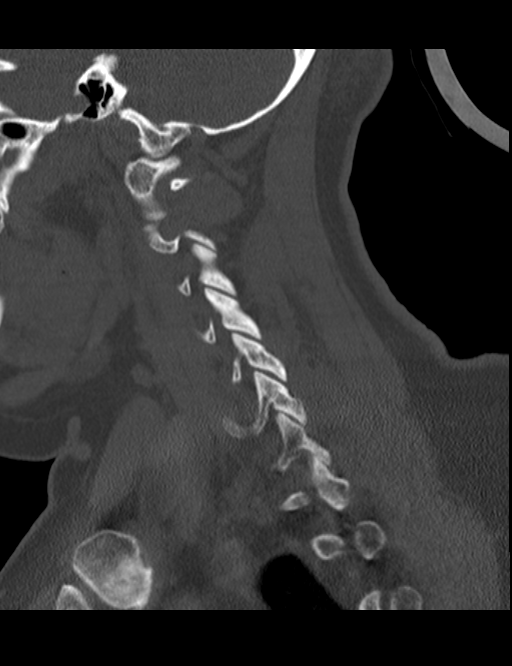
[im 24/47  bone]
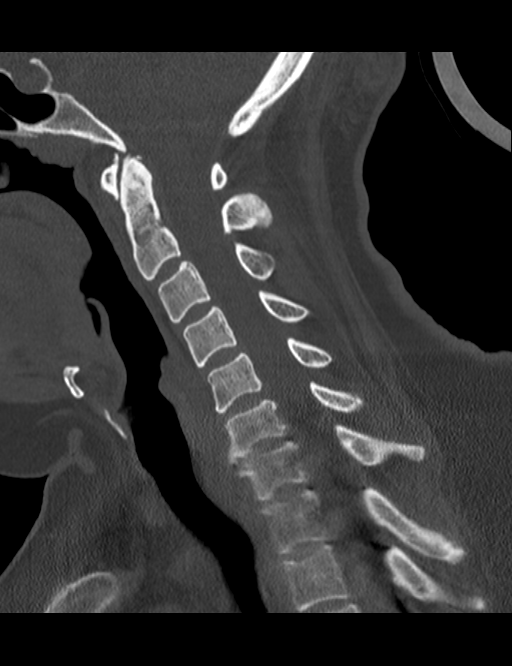
[im 35/47  bone]
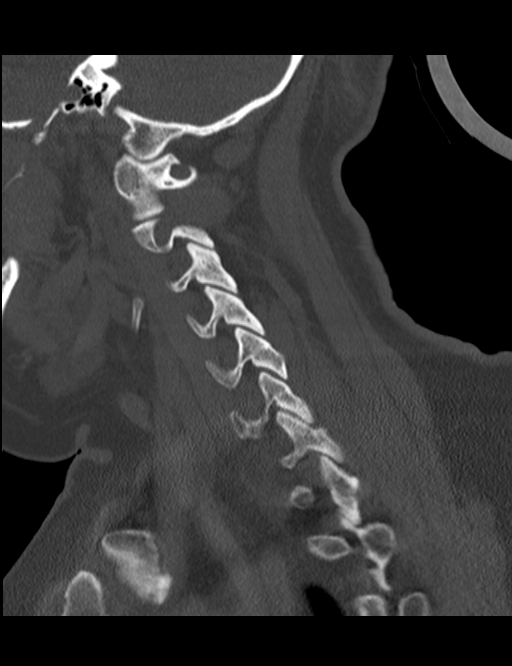

[6 of 33 positions shown; findings below may reference images not displayed]

FINDINGS: CT HEAD FINDINGS

Brain: Negative. No evidence of acute infarction, hemorrhage,
hydrocephalus, extra-axial collection or mass lesion/mass effect.

Vascular: Negative

Skull: Occipital scalp contusion without fracture.

Sinuses/Orbits: Negative

CT CERVICAL SPINE FINDINGS

Alignment: No traumatic malalignment.

Skull base and vertebrae: Negative for fracture. Sclerosis in the C7
left lamina is consistent with a bone island.

Soft tissues and spinal canal: No prevertebral fluid or swelling. No
visible canal hematoma.

Disc levels: C6-7 disc degeneration with endplate spurring and disc
narrowing. No evidence of impingement

Upper chest: Negative
IMPRESSION: 1. No evidence of intracranial or cervical spine injury.
2. Occipital scalp contusion without fracture.

## 2018-06-11 DIAGNOSIS — E119 Type 2 diabetes mellitus without complications: Secondary | ICD-10-CM | POA: Diagnosis not present

## 2018-06-11 DIAGNOSIS — R030 Elevated blood-pressure reading, without diagnosis of hypertension: Secondary | ICD-10-CM | POA: Diagnosis not present

## 2018-06-11 DIAGNOSIS — E282 Polycystic ovarian syndrome: Secondary | ICD-10-CM | POA: Diagnosis not present

## 2018-06-27 DIAGNOSIS — E1165 Type 2 diabetes mellitus with hyperglycemia: Secondary | ICD-10-CM | POA: Diagnosis not present

## 2018-06-27 DIAGNOSIS — F418 Other specified anxiety disorders: Secondary | ICD-10-CM | POA: Diagnosis not present

## 2018-06-27 DIAGNOSIS — F331 Major depressive disorder, recurrent, moderate: Secondary | ICD-10-CM | POA: Diagnosis not present

## 2019-02-06 DIAGNOSIS — E1165 Type 2 diabetes mellitus with hyperglycemia: Secondary | ICD-10-CM | POA: Diagnosis not present

## 2019-02-06 DIAGNOSIS — I7300 Raynaud's syndrome without gangrene: Secondary | ICD-10-CM | POA: Diagnosis not present

## 2019-02-06 DIAGNOSIS — N951 Menopausal and female climacteric states: Secondary | ICD-10-CM | POA: Diagnosis not present

## 2019-06-30 DIAGNOSIS — E1165 Type 2 diabetes mellitus with hyperglycemia: Secondary | ICD-10-CM | POA: Diagnosis not present

## 2019-07-01 DIAGNOSIS — F419 Anxiety disorder, unspecified: Secondary | ICD-10-CM | POA: Diagnosis not present

## 2019-07-01 DIAGNOSIS — R030 Elevated blood-pressure reading, without diagnosis of hypertension: Secondary | ICD-10-CM | POA: Diagnosis not present

## 2019-07-01 DIAGNOSIS — E1165 Type 2 diabetes mellitus with hyperglycemia: Secondary | ICD-10-CM | POA: Diagnosis not present

## 2019-07-01 DIAGNOSIS — M5380 Other specified dorsopathies, site unspecified: Secondary | ICD-10-CM | POA: Diagnosis not present

## 2019-07-09 DIAGNOSIS — Z1159 Encounter for screening for other viral diseases: Secondary | ICD-10-CM | POA: Diagnosis not present

## 2019-09-05 DIAGNOSIS — R7401 Elevation of levels of liver transaminase levels: Secondary | ICD-10-CM | POA: Diagnosis not present

## 2019-09-05 DIAGNOSIS — E1165 Type 2 diabetes mellitus with hyperglycemia: Secondary | ICD-10-CM | POA: Diagnosis not present

## 2019-09-05 DIAGNOSIS — Z0000 Encounter for general adult medical examination without abnormal findings: Secondary | ICD-10-CM | POA: Diagnosis not present

## 2019-09-10 DIAGNOSIS — Z1331 Encounter for screening for depression: Secondary | ICD-10-CM | POA: Diagnosis not present

## 2019-09-10 DIAGNOSIS — F331 Major depressive disorder, recurrent, moderate: Secondary | ICD-10-CM | POA: Diagnosis not present

## 2019-09-10 DIAGNOSIS — Z0000 Encounter for general adult medical examination without abnormal findings: Secondary | ICD-10-CM | POA: Diagnosis not present

## 2019-09-10 DIAGNOSIS — F419 Anxiety disorder, unspecified: Secondary | ICD-10-CM | POA: Diagnosis not present

## 2019-09-10 DIAGNOSIS — M179 Osteoarthritis of knee, unspecified: Secondary | ICD-10-CM | POA: Diagnosis not present

## 2019-11-06 DIAGNOSIS — Z03818 Encounter for observation for suspected exposure to other biological agents ruled out: Secondary | ICD-10-CM | POA: Diagnosis not present

## 2019-11-06 DIAGNOSIS — Z20828 Contact with and (suspected) exposure to other viral communicable diseases: Secondary | ICD-10-CM | POA: Diagnosis not present

## 2019-12-16 DIAGNOSIS — E1165 Type 2 diabetes mellitus with hyperglycemia: Secondary | ICD-10-CM | POA: Diagnosis not present

## 2019-12-19 DIAGNOSIS — E1165 Type 2 diabetes mellitus with hyperglycemia: Secondary | ICD-10-CM | POA: Diagnosis not present

## 2019-12-19 DIAGNOSIS — Z1331 Encounter for screening for depression: Secondary | ICD-10-CM | POA: Diagnosis not present

## 2019-12-19 DIAGNOSIS — G894 Chronic pain syndrome: Secondary | ICD-10-CM | POA: Diagnosis not present

## 2019-12-19 DIAGNOSIS — F419 Anxiety disorder, unspecified: Secondary | ICD-10-CM | POA: Diagnosis not present

## 2019-12-19 DIAGNOSIS — I10 Essential (primary) hypertension: Secondary | ICD-10-CM | POA: Diagnosis not present

## 2020-03-10 DIAGNOSIS — G4700 Insomnia, unspecified: Secondary | ICD-10-CM | POA: Diagnosis not present

## 2020-03-10 DIAGNOSIS — I10 Essential (primary) hypertension: Secondary | ICD-10-CM | POA: Diagnosis not present

## 2020-03-10 DIAGNOSIS — E1165 Type 2 diabetes mellitus with hyperglycemia: Secondary | ICD-10-CM | POA: Diagnosis not present

## 2020-07-14 DIAGNOSIS — F17200 Nicotine dependence, unspecified, uncomplicated: Secondary | ICD-10-CM | POA: Diagnosis not present

## 2020-07-14 DIAGNOSIS — E1165 Type 2 diabetes mellitus with hyperglycemia: Secondary | ICD-10-CM | POA: Diagnosis not present

## 2020-07-28 DIAGNOSIS — E1165 Type 2 diabetes mellitus with hyperglycemia: Secondary | ICD-10-CM | POA: Diagnosis not present

## 2020-08-13 DIAGNOSIS — M79671 Pain in right foot: Secondary | ICD-10-CM | POA: Diagnosis not present

## 2020-08-13 DIAGNOSIS — S92911A Unspecified fracture of right toe(s), initial encounter for closed fracture: Secondary | ICD-10-CM | POA: Diagnosis not present

## 2020-08-13 DIAGNOSIS — L819 Disorder of pigmentation, unspecified: Secondary | ICD-10-CM | POA: Diagnosis not present

## 2020-09-15 DIAGNOSIS — Z0000 Encounter for general adult medical examination without abnormal findings: Secondary | ICD-10-CM | POA: Diagnosis not present

## 2020-09-15 DIAGNOSIS — E1165 Type 2 diabetes mellitus with hyperglycemia: Secondary | ICD-10-CM | POA: Diagnosis not present

## 2020-09-15 DIAGNOSIS — I10 Essential (primary) hypertension: Secondary | ICD-10-CM | POA: Diagnosis not present

## 2020-09-21 DIAGNOSIS — Z0000 Encounter for general adult medical examination without abnormal findings: Secondary | ICD-10-CM | POA: Diagnosis not present

## 2020-09-21 DIAGNOSIS — E1165 Type 2 diabetes mellitus with hyperglycemia: Secondary | ICD-10-CM | POA: Diagnosis not present

## 2020-09-21 DIAGNOSIS — E669 Obesity, unspecified: Secondary | ICD-10-CM | POA: Diagnosis not present

## 2020-11-10 DIAGNOSIS — Z20828 Contact with and (suspected) exposure to other viral communicable diseases: Secondary | ICD-10-CM | POA: Diagnosis not present

## 2021-03-29 DIAGNOSIS — E1165 Type 2 diabetes mellitus with hyperglycemia: Secondary | ICD-10-CM | POA: Diagnosis not present
